# Patient Record
Sex: Male | Born: 2000 | Hispanic: Yes | Marital: Single | Smoking: Never smoker
Health system: Southern US, Community
[De-identification: ages and names within clinical notes are randomized; demographics above are authoritative.]

## PROBLEM LIST (undated history)

## (undated) DIAGNOSIS — M5126 Other intervertebral disc displacement, lumbar region: Secondary | ICD-10-CM

## (undated) HISTORY — PX: WISDOM TOOTH EXTRACTION: SHX21

---
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
## 2020-03-09 ENCOUNTER — Encounter (HOSPITAL_COMMUNITY): Payer: Self-pay | Admitting: *Deleted

## 2020-03-09 ENCOUNTER — Encounter (HOSPITAL_COMMUNITY): Payer: Self-pay

## 2020-03-09 ENCOUNTER — Emergency Department (HOSPITAL_COMMUNITY)
Admission: EM | Admit: 2020-03-09 | Discharge: 2020-03-09 | Disposition: A | Discharge status: Home / Self Care | Payer: Medicaid Other | Attending: Emergency Medicine | Admitting: Emergency Medicine

## 2020-03-09 ENCOUNTER — Emergency Department (HOSPITAL_COMMUNITY): Payer: Medicaid Other

## 2020-03-09 ENCOUNTER — Other Ambulatory Visit: Payer: Self-pay

## 2020-03-09 ENCOUNTER — Ambulatory Visit (HOSPITAL_COMMUNITY)
Admission: EM | Admit: 2020-03-09 | Discharge: 2020-03-09 | Disposition: A | Discharge status: Other Acute Inpatient Hospital | Payer: Medicaid Other

## 2020-03-09 DIAGNOSIS — H53149 Visual discomfort, unspecified: Secondary | ICD-10-CM | POA: Insufficient documentation

## 2020-03-09 DIAGNOSIS — R5383 Other fatigue: Secondary | ICD-10-CM | POA: Insufficient documentation

## 2020-03-09 DIAGNOSIS — R519 Headache, unspecified: Secondary | ICD-10-CM | POA: Insufficient documentation

## 2020-03-09 HISTORY — DX: Other intervertebral disc displacement, lumbar region: M51.26

## 2020-03-09 MED ORDER — DIPHENHYDRAMINE HCL 50 MG/ML IJ SOLN
25.0000 mg | Freq: Once | INTRAMUSCULAR | Status: AC
  Administered 2020-03-09: 25 mg via INTRAVENOUS
  Filled 2020-03-09: qty 1

## 2020-03-09 MED ORDER — KETOROLAC TROMETHAMINE 15 MG/ML IJ SOLN
15.0000 mg | Freq: Once | INTRAMUSCULAR | Status: AC
  Administered 2020-03-09: 15 mg via INTRAVENOUS
  Filled 2020-03-09: qty 1

## 2020-03-09 MED ORDER — PROCHLORPERAZINE EDISYLATE 10 MG/2ML IJ SOLN
10.0000 mg | Freq: Once | INTRAMUSCULAR | Status: AC
  Administered 2020-03-09: 10 mg via INTRAVENOUS
  Filled 2020-03-09: qty 2

## 2020-03-09 MED ORDER — LACTATED RINGERS IV BOLUS
1000.0000 mL | Freq: Once | INTRAVENOUS | Status: AC
  Administered 2020-03-09: 1000 mL via INTRAVENOUS

## 2020-03-09 NOTE — ED Triage Notes (Signed)
Pt sent here from [facility] due to headache and dizziness x 1 week. Reports having episode earlier today of tremor to hand while writing and then "cloudy" vision. No acute distress is noted at triage.

## 2020-03-09 NOTE — ED Triage Notes (Signed)
Pt presents with headache and dizziness X 1 week.

## 2020-03-09 NOTE — ED Provider Notes (Signed)
[facility] EMERGENCY DEPARTMENT Provider Note   CSN: [id number] Arrival date & time: 03/09/20  1206     History Chief Complaint  Patient presents with  . Headache    Steven Allison is a 20 y.o. male with no significant history who presents to the ED for headache. Gradual onset generalized headache for the last week. Patient states he may have struck his head the day before he developed headache. Pain described as throbbing and non-radiating. Associated photophobia. Pain waxing and waning since onset. Not similar to previous headaches. No relief with NSAIDs. Today, patient stated his vision became blurry and noted tremor to his right hand while writing, prompting him to come to the ED for further evaluation. Blurry vision and tremor have since resolved. He reports being under significant stress relating to some of friends. Denies fever, chills, congestion, neck pain, tinnitus, weakness, numbness, dizziness, or trouble walking.  The history is provided by the patient and medical records.  Headache Pain location:  Generalized Quality: throbbing. Radiates to:  Does not radiate Onset quality:  Gradual Duration:  1 week Timing:  Constant Progression:  Waxing and waning Chronicity:  New Similar to prior headaches: no   Context comment:  Spontaneous Relieved by:  Nothing Worsened by:  Light Ineffective treatments:  NSAIDs Associated symptoms: blurred vision, fatigue and photophobia   Associated symptoms: no abdominal pain, no back pain, no congestion, no cough, no dizziness, no ear pain, no eye pain, no fever, no focal weakness, no hearing loss, no loss of balance, no nausea, no neck pain, no neck stiffness, no seizures, no sore throat, no tingling and no vomiting   Risk factors: does not have insomnia        Past Medical History:  Diagnosis Date  . Lumbar herniated disc     Patient Active Problem List   Diagnosis Date Noted  . 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 Back pain with left-sided sciatica 03/24/2016  . Elevated BP 09/18/2012  . Obesity 09/12/2012    Past Surgical History:  Procedure Laterality Date  . WISDOM TOOTH EXTRACTION Right    bottom right side       Family History  Problem Relation Age of Onset  . Epilepsy Maternal Uncle   . Heart attack Maternal Grandfather     Social History   Tobacco Use  . Smoking status: Never Smoker  . Smokeless tobacco: Never Used  Substance Use Topics  . Alcohol use: No  . Drug use: No    Home Medications Prior to Admission medications   Medication Sig Start Date End Date Taking? Authorizing Provider  gabapentin (NEURONTIN) 300 MG capsule Take 1 capsule (300 mg total) by mouth 2 (two) times daily. 06/09/16   Keturah Shavers, MD  naproxen (NAPROSYN) 500 MG tablet Take 1 tablet (500 mg total) by mouth at bedtime as needed. 06/09/16   Keturah Shavers, MD    Allergies    Patient has no known allergies.  Review of Systems   Review of Systems  Constitutional: Positive for fatigue. Negative for chills and fever.  HENT: Negative for congestion, ear pain, hearing loss and sore throat.   Eyes: Positive for blurred vision and photophobia. Negative for pain and visual disturbance.  Respiratory: Negative for cough and shortness of breath.   Cardiovascular: Negative for chest pain and palpitations.  Gastrointestinal: Negative for abdominal pain, nausea and vomiting.  Genitourinary: Negative for dysuria and hematuria.  Musculoskeletal: Negative for arthralgias, back  pain, neck pain and neck stiffness.  Skin: Negative for color change and rash.  Neurological: Positive for headaches. Negative for dizziness, focal weakness, seizures, syncope and loss of balance.  All other systems reviewed and are negative.   Physical Exam Updated Vital Signs BP (!) 155/98   Pulse 78   Temp 98.3 F (36.8 C) (Oral)   Resp 18   SpO2 100%   Physical Exam Vitals and  nursing note reviewed.  Constitutional:      General: He is not in acute distress.    Appearance: Normal appearance. He is well-developed and well-nourished. He is not ill-appearing.  HENT:     Head: Normocephalic and atraumatic.     Right Ear: External ear normal.     Left Ear: External ear normal.     Nose: Nose normal.     Mouth/Throat:     Mouth: Mucous membranes are moist.     Pharynx: Oropharynx is clear. No oropharyngeal exudate or posterior oropharyngeal erythema.  Eyes:     General: No visual field deficit or scleral icterus.       Right eye: No discharge.        Left eye: No discharge.     Extraocular Movements: Extraocular movements intact.     Conjunctiva/sclera: Conjunctivae normal.     Pupils: Pupils are equal, round, and reactive to light.  Cardiovascular:     Rate and Rhythm: Normal rate and regular rhythm.  Pulmonary:     Effort: Pulmonary effort is normal. No respiratory distress.  Musculoskeletal:        General: No edema.     Cervical back: Neck supple.  Skin:    General: Skin is warm and dry.     Findings: No rash.  Neurological:     General: No focal deficit present.     Mental Status: He is alert and oriented to person, place, and time.     GCS: GCS eye subscore is 4. GCS verbal subscore is 5. GCS motor subscore is 6.     Cranial Nerves: Cranial nerves are intact. No cranial nerve deficit, dysarthria or facial asymmetry.     Sensory: Sensation is intact. No sensory deficit.     Motor: Motor function is intact. No weakness.     Coordination: Coordination is intact. Finger-Nose-Finger Test and Heel to [facility] Test normal.  Psychiatric:        Mood and Affect: Mood and affect and mood normal.        Behavior: Behavior normal.     ED Results / Procedures / Treatments   Labs (all labs ordered are listed, but only abnormal results are displayed) Labs Reviewed - No data to display  EKG None  Radiology CT Head Wo Contrast  Result Date:  03/09/2020 CLINICAL DATA:  Persistent headaches. EXAM: CT HEAD WITHOUT CONTRAST TECHNIQUE: Contiguous axial images were obtained from the base of the skull through the vertex without intravenous contrast. COMPARISON:  None. FINDINGS: Brain: No mass lesion, hemorrhage, hydrocephalus, acute infarct, intra-axial, or extra-axial fluid collection. Vascular: No hyperdense vessel or unexpected calcification. Skull: No significant soft tissue swelling.  No skull fracture. Sinuses/Orbits: Normal imaged portions of the orbits and globes. Clear paranasal sinuses and mastoid air cells. Other: None. IMPRESSION: Normal head CT.  No explanation for headaches. Electronically Signed   By: Jeronimo Greaves M.D.   On: 03/09/2020 19:52    Procedures Procedures  Medications Ordered in ED Medications  ketorolac (TORADOL) 15 MG/ML injection 15 mg (15  mg Intravenous Given 03/09/20 2033)  prochlorperazine (COMPAZINE) injection 10 mg (10 mg Intravenous Given 03/09/20 2033)  diphenhydrAMINE (BENADRYL) injection 25 mg (25 mg Intravenous Given 03/09/20 2032)  lactated ringers bolus 1,000 mL (0 mLs Intravenous Stopped 03/09/20 2126)    ED Course  I have reviewed the triage vital signs and the nursing notes.  Pertinent labs & imaging results that were available during my care of the patient were reviewed by me and considered in my medical decision making (see chart for details).    MDM Rules/Calculators/A&P                          Patient is a 19yoM with history and physical as described above who presents to the ED for headache. VS reassuring and HDS. Neurologically intact on exam. Patient resting comfortably and in no acute distress. Initial workup includes CT head. Initial treatment includes Toradol, compazine, benadryl, and IVF.  CT head unremarkable. On reassess following medication administration, patient states he is feeling much better and would like to go home. HPI, physical exam, and diagnostic workup not consistent  with ICH, skull fracture, space occupying lesion, hydrocephalus, meningitis, CVA/TIA, cerebral sinus thrombosis, or other acute emergent pathology. Suspect presentation likely tension headache vs atypical migraine. Recommend follow up with PCP if he continues to have trouble with headaches. Strict return precautions provided and discussed. Questions and concerns addressed. Patient verbalized understanding and amenable with discharge planning. Patient discharge in stable condition. Final Clinical Impression(s) / ED Diagnoses Final diagnoses:  Acute nonintractable headache, unspecified headache type    Rx / DC Orders ED Discharge Orders    None       Tonia Brooms, MD 03/09/20 2135    Eber Hong, MD 03/09/20 2243

## 2020-11-29 ENCOUNTER — Emergency Department (HOSPITAL_BASED_OUTPATIENT_CLINIC_OR_DEPARTMENT_OTHER)
Admission: EM | Admit: 2020-11-29 | Discharge: 2020-11-30 | Disposition: A | Discharge status: Home / Self Care | Payer: Medicaid Other | Attending: Emergency Medicine | Admitting: Emergency Medicine

## 2020-11-29 ENCOUNTER — Encounter (HOSPITAL_BASED_OUTPATIENT_CLINIC_OR_DEPARTMENT_OTHER): Payer: Self-pay | Admitting: *Deleted

## 2020-11-29 ENCOUNTER — Other Ambulatory Visit: Payer: Self-pay

## 2020-11-29 DIAGNOSIS — G44229 Chronic tension-type headache, not intractable: Secondary | ICD-10-CM | POA: Diagnosis not present

## 2020-11-29 DIAGNOSIS — R001 Bradycardia, unspecified: Secondary | ICD-10-CM | POA: Insufficient documentation

## 2020-11-29 DIAGNOSIS — R519 Headache, unspecified: Secondary | ICD-10-CM | POA: Diagnosis present

## 2020-11-29 DIAGNOSIS — H53149 Visual discomfort, unspecified: Secondary | ICD-10-CM | POA: Diagnosis not present

## 2020-11-29 NOTE — ED Triage Notes (Signed)
Pt reports recurrent headache x 3 weeks. Today he took flexeril which helped. Endorses nausea

## 2020-11-30 NOTE — ED Provider Notes (Signed)
[facility] EMERGENCY DEPARTMENT Provider Note   CSN: [id number] Arrival date & time: 11/29/20  1834     History Chief Complaint  Patient presents with   Headache    Steven Allison is a 20 y.o. male.  Patient with no past medical history presents today with complaint of headache.  He states that it is intermittent in nature and has been ongoing for the last 3 weeks.  He denies any associated injury, does endorse some increased stress recently.  He describes the pain as bilateral not preceded by aura and throbbing in nature.  He states that the pain is tight throughout his head and does not radiate.  It is also not positional.  He does endorse some associated photophobia.  Does state that he has had similar headaches in the past.  He has tried Tylenol and ibuprofen without relief of symptoms.  He did state that he took Flexeril which gave him some relief.  He denies any vision changes, dizziness, lightheadedness, fever, chills, congestion, neck pain, tinnitus, weakness, numbness, dizziness, trouble walking, nausea, or vomiting.  Additionally, he states that in the time it took for him to be seen his headache has resolved and he is now asymptomatic.    The history is provided by the patient. No language interpreter was used.  Headache Associated symptoms: no back pain, no congestion, no cough, no diarrhea, no dizziness, no eye pain, no fever, no nausea, no neck pain, no neck stiffness, no numbness, no seizures, no sinus pressure, no vomiting and no weakness       Past Medical History:  Diagnosis Date   Lumbar herniated disc     Patient Active Problem List   Diagnosis Date Noted   Protrusion of intervertebral disc of lumbosacral region 05/09/2016   Back pain with left-sided sciatica 03/24/2016   Elevated BP 09/18/2012   Obesity 09/12/2012    Past Surgical History:  Procedure Laterality Date   WISDOM TOOTH EXTRACTION Right    bottom right side       Family  History  Problem Relation Age of Onset   Epilepsy Maternal Uncle    Heart attack Maternal Grandfather     Social History   Tobacco Use   Smoking status: Never   Smokeless tobacco: Never  Vaping Use   Vaping Use: Never used  Substance Use Topics   Alcohol use: Not Currently   Drug use: No    Home Medications Prior to Admission medications   Medication Sig Start Date End Date Taking? Authorizing Provider  gabapentin (NEURONTIN) 300 MG capsule Take 1 capsule (300 mg total) by mouth 2 (two) times daily. 06/09/16   Keturah Shavers, MD  naproxen (NAPROSYN) 500 MG tablet Take 1 tablet (500 mg total) by mouth at bedtime as needed. 06/09/16   Keturah Shavers, MD    Allergies    Patient has no known allergies.  Review of Systems   Review of Systems  Constitutional:  Negative for chills and fever.  HENT:  Negative for congestion, rhinorrhea, sinus pressure and sinus pain.   Eyes:  Negative for pain.  Respiratory:  Negative for cough and shortness of breath.   Cardiovascular:  Negative for chest pain.  Gastrointestinal:  Negative for diarrhea, nausea and vomiting.  Musculoskeletal:  Negative for back pain, gait problem, neck pain and neck stiffness.  Skin:  Negative for rash.  Neurological:  Positive for headaches. Negative for dizziness, tremors, seizures, syncope, facial asymmetry, speech difficulty, weakness, light-headedness and numbness.  Psychiatric/Behavioral:  Negative for confusion and decreased concentration.   All other systems reviewed and are negative.  Physical Exam Updated Vital Signs BP 133/83   Pulse (!) 44   Temp 98.2 F (36.8 C) (Oral)   Resp 20   Ht 5\' 10"  (1.778 m)   Wt 104.3 kg   SpO2 98%   BMI 33.00 kg/m   Physical Exam Vitals and nursing note reviewed.  Constitutional:      General: He is not in acute distress.    Appearance: Normal appearance. He is well-developed and normal weight. He is not ill-appearing, toxic-appearing or diaphoretic.      Comments: Patient well-appearing, sitting in bed in no acute distress.  HENT:     Head: Normocephalic and atraumatic.     Comments: No signs of trauma or other visible abnormalities. Eyes:     General: No visual field deficit.    Extraocular Movements: Extraocular movements intact.     Right eye: Normal extraocular motion and no nystagmus.     Left eye: Normal extraocular motion and no nystagmus.     Pupils: Pupils are equal, round, and reactive to light.     Right eye: Pupil is round and reactive.     Left eye: Pupil is round and reactive.  Neck:     Meningeal: Brudzinski's sign and Kernig's sign absent.  Cardiovascular:     Rate and Rhythm: Normal rate and regular rhythm.     Heart sounds: Normal heart sounds.     Comments: Bradycardia noted on documented vital signs, however patient's heart rate remained in the 70s throughout my examination. Pulmonary:     Effort: Pulmonary effort is normal. No respiratory distress.     Breath sounds: Normal breath sounds. No stridor. No wheezing, rhonchi or rales.  Chest:     Chest wall: No tenderness.  Abdominal:     General: Bowel sounds are normal.     Palpations: Abdomen is soft.  Musculoskeletal:        General: Normal range of motion.     Cervical back: Normal range of motion and neck supple. No rigidity.  Lymphadenopathy:     Cervical: No cervical adenopathy.  Skin:    General: Skin is warm and dry.  Neurological:     Mental Status: He is alert and oriented to person, place, and time.     GCS: GCS eye subscore is 4. GCS verbal subscore is 5. GCS motor subscore is 6.     Cranial Nerves: No cranial nerve deficit, dysarthria or facial asymmetry.     Sensory: Sensation is intact. No sensory deficit.     Motor: Motor function is intact. No weakness.     Coordination: Coordination is intact. Coordination normal.     Gait: Gait is intact. Gait normal.     Comments: Alert and oriented to self, place, time and event.    Speech is fluent,  clear without dysarthria or dysphasia.    Strength 5/5 in upper/lower extremities   Sensation intact in upper/lower extremities    CN I not tested  CN II grossly intact visual fields bilaterally. Did not visualize posterior eye.  CN III, IV, VI PERRLA and EOMs intact bilaterally  CN V Intact sensation to sharp and light touch to the face  CN VII facial movements symmetric  CN VIII not tested  CN IX, X no uvula deviation, symmetric rise of soft palate  CN XI 5/5 SCM and trapezius strength bilaterally  CN XII Midline  tongue protrusion, symmetric L/R movements   Psychiatric:        Mood and Affect: Mood normal.        Behavior: Behavior normal.    ED Results / Procedures / Treatments   Labs (all labs ordered are listed, but only abnormal results are displayed) Labs Reviewed - No data to display  EKG None  Radiology No results found.  Procedures Procedures   Medications Ordered in ED Medications - No data to display  ED Course  I have reviewed the triage vital signs and the nursing notes.  Pertinent labs & imaging results that were available during my care of the patient were reviewed by me and considered in my medical decision making (see chart for details).    MDM Rules/Calculators/A&P                         Steven Allison presents with headache Given the large differential diagnosis for Steven Allison, the decision making in this case is of high complexity.  After evaluating all of the data points in this case, the presentation of Steven Allison is NOT consistent with skull fracture, meningitis/encephalitis, SAH/sentinel bleed, Intracranial Hemorrhage (ICH) (subdural/epidural), acute obstructive hydrocephalus, space occupying lesions, CVA,  Basilar/vertebral artery dissection, hypertensive emergency, temporal Arteritis, Idiopathic Intracranial Hypertension (pseudotumor cerebri).  Patient with completely normal neuro exam, states that he has had  increased stress recently.  Also endorses that he has been worked up for his headaches several times with interventions including CT imaging at the beginning of the year which was unremarkable for acute findings.  He states that this headache is similar to previous when he had these interventions.  Therefore, I do not feel like additional imaging is indicated.  Furthermore, given extended wait times in the emergency department this evening, the patient states that his headache resolved several hours prior to being seen.  He also states that he did not like headache cocktail given to him several times in the past, he only presented today for recommendations for management of his headaches in the future.  Feel that patient's headaches are likely tension headaches, will recommend management with supportive care including stress reduction, plenty of oral hydration, and Tylenol/ibuprofen as needed for additional relief.  However, patient educated on drug rebound headaches that can occur if you take Tylenol/ibuprofen in excess.  Patient expresses understanding and is amenable with this plan, also told to follow-up with his PCP for further evaluation.  He has been educated on red flag symptoms that would prompt immediate return.  Patient discharged in stable condition.  Strict return and follow-up precautions have been given by me personally or by detailed written instructions verbalized by nursing staff using the teach back method to patient/family/caregiver.  Data Reviewed/Counseling: I have reviewed the patient's vital signs, nursing notes, and other relevant tests/information. I had a detailed discussion regarding the historical points, exam findings, and any diagnostic results supporting the discharge diagnosis. I also discussed the need for outpatient follow-up and the need to return to the ED if symptoms worsen or if there are any questions or concerns that arise at hom   Final Clinical Impression(s) / ED  Diagnoses Final diagnoses:  Chronic tension-type headache, not intractable    Rx / DC Orders ED Discharge Orders     None     An After Visit Summary was printed and given to the patient.    Silva Bandy, PA-C 11/30/20 0101  Vanetta Mulders, MD 12/11/20 [phone]

## 2020-11-30 NOTE — Discharge Instructions (Signed)
You were seen today in the emergency department for evaluation of your headache.  Your neurologic exam was normal and therefore I do not think imaging of your head is warranted.  Fortunately, it seems that your headache has resolved since her presentation to the emergency department.  Your symptoms are consistent with a tension headache which is usually caused from stress.  I recommend you manage her symptoms with over-the-counter Excedrin as needed, however be aware that drug rebound headaches can occur if you take more than the prescribed amount.  Additionally supportive care is indicated including plenty of oral hydration.  It is also very important that you find ways to reduce your stress including deep breathing and relaxation techniques with plenty of rest.  I suspect that this will help you to resolve your symptoms.  If these continue despite these interventions, I recommend follow-up with your primary care for long-term evaluation and management.  Return if development of new or worsening symptoms.

## 2022-02-08 IMAGING — CT CT HEAD W/O CM
4 series · 16 of 47 positions shown, 18 images · non-contrast
Comparison: None.

CLINICAL DATA: Persistent headaches.

EXAM:
CT HEAD WITHOUT CONTRAST
TECHNIQUE: Contiguous axial images were obtained from the base of the skull
through the vertex without intravenous contrast.

[Series 3: head wo · axial · 0.45mm/px · z∈[-122,-2]mm · 7 of 32 slices shown, 9 images]
[im 4/32  brain]
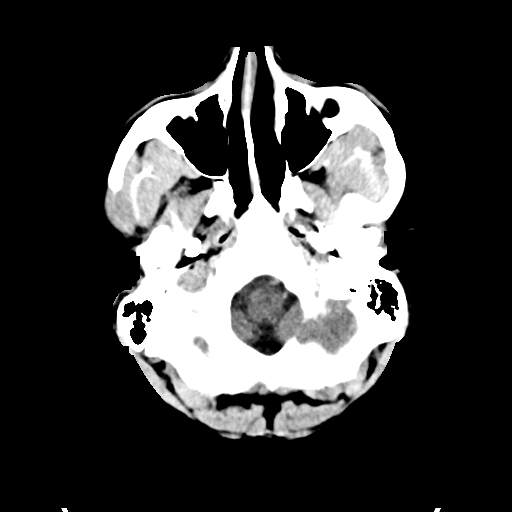
[im 4/32  bone]
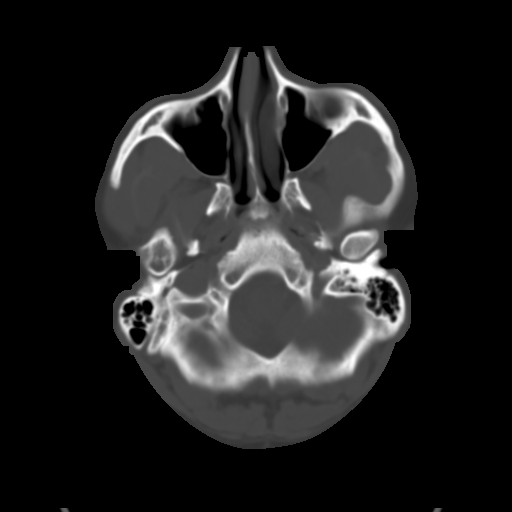
[im 8/32  brain]
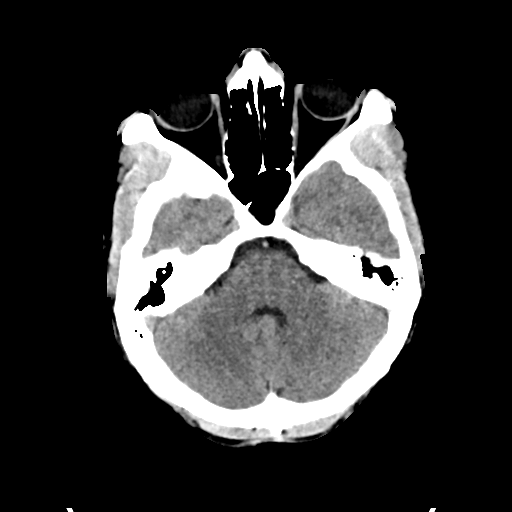
[im 12/32  brain]
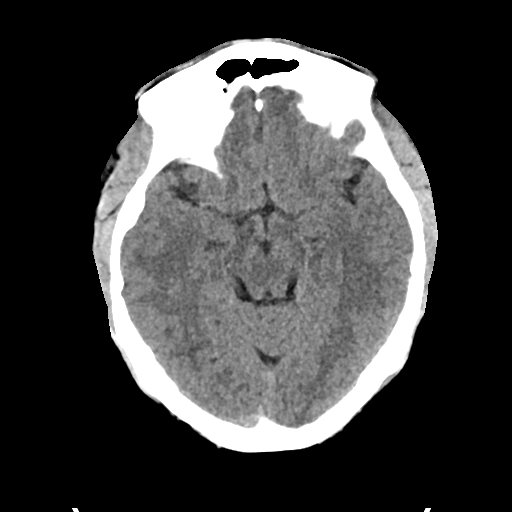
[im 16/32  brain]
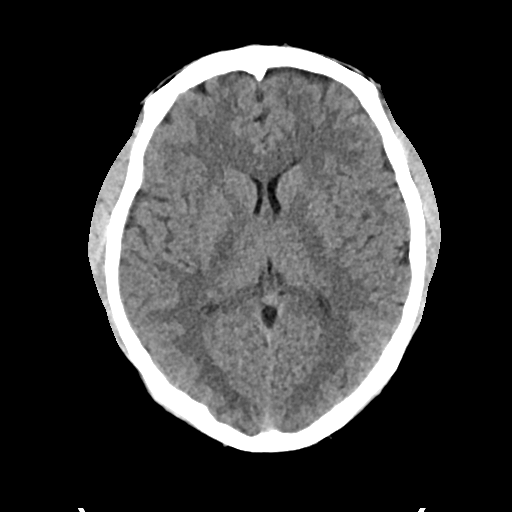
[im 20/32  brain]
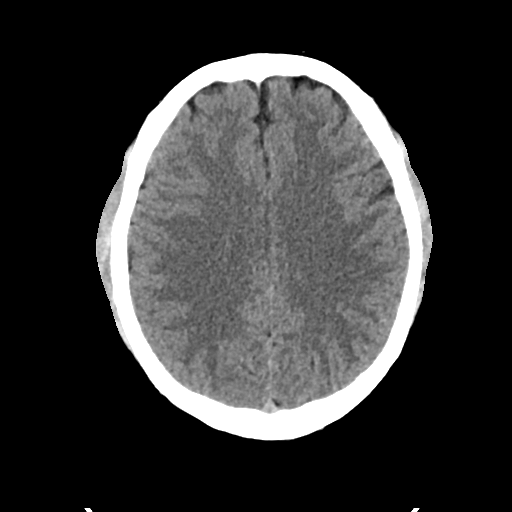
[im 20/32  bone]
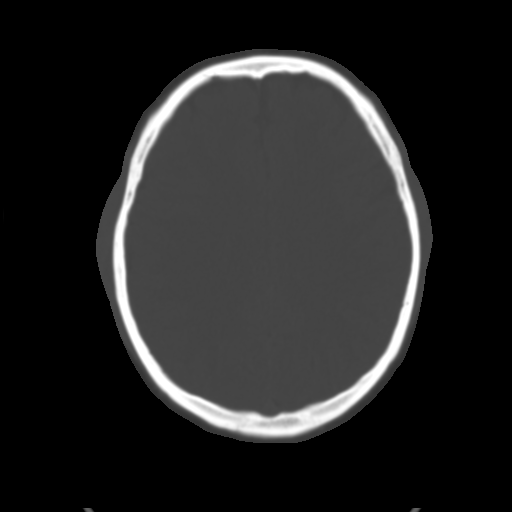
[im 24/32  brain]
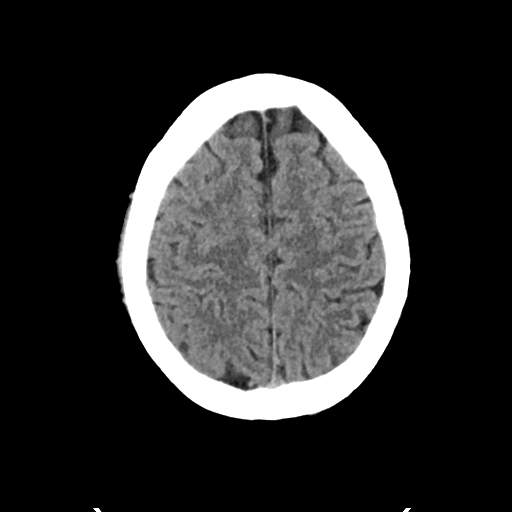
[im 28/32  brain]
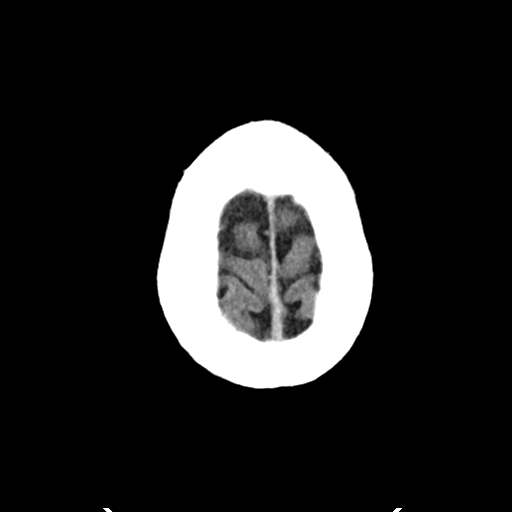

[Series 4: head bone · axial · 0.45mm/px · z∈[-124,-92]mm · 3 of 80 slices shown]
[im 8/80  bone]
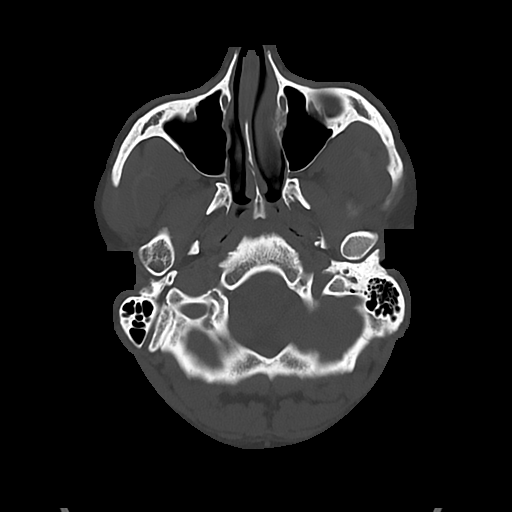
[im 16/80  bone]
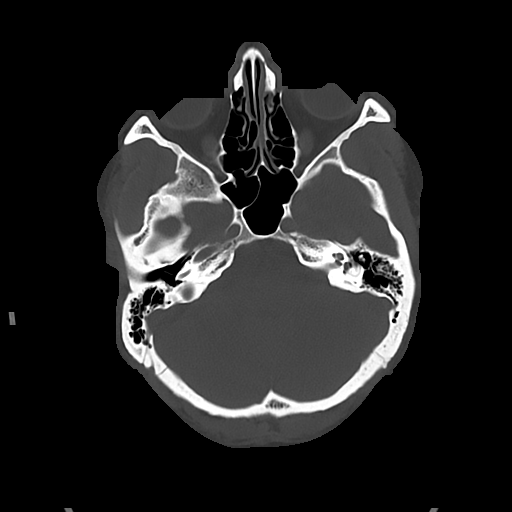
[im 24/80  bone]
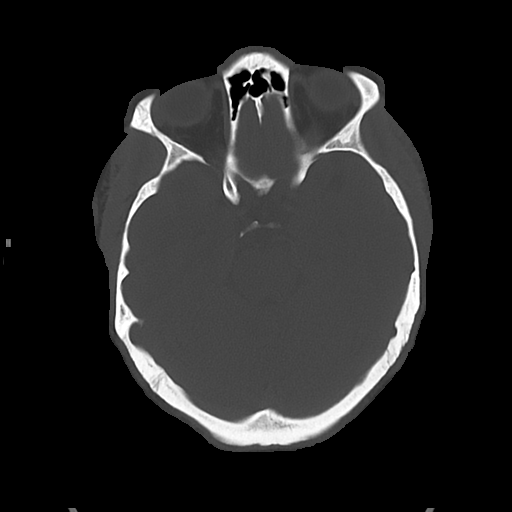

[Series 5: cor soft · coronal · 0.35mm/px · 3 of 71 slices shown]
[im 24/71  brain]
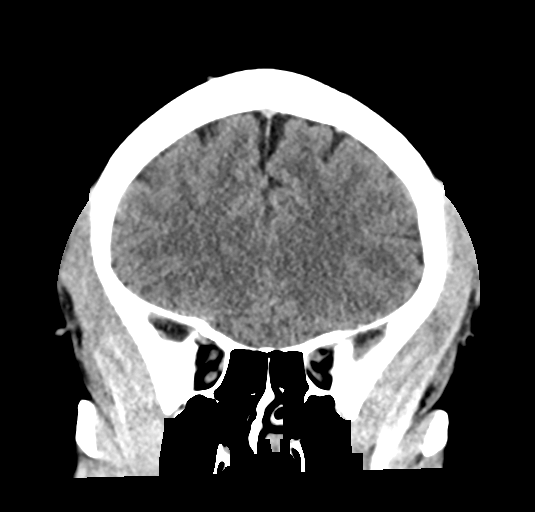
[im 32/71  brain]
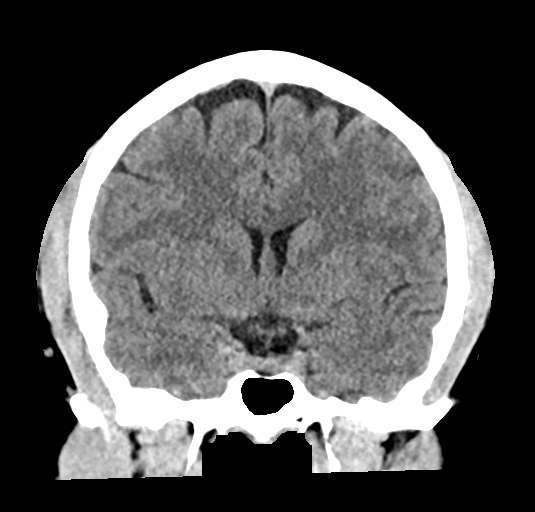
[im 39/71  brain]
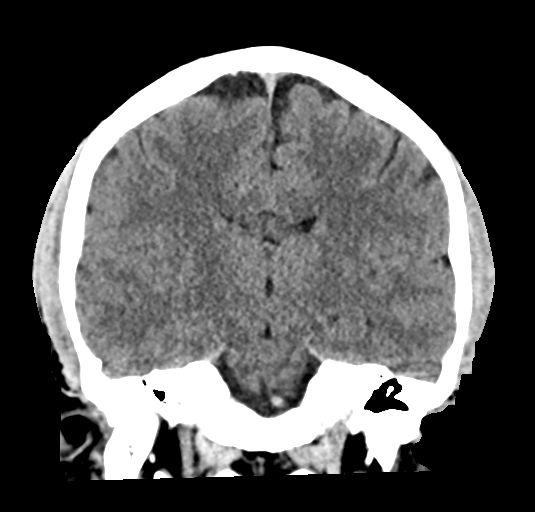

[Series 6: sag soft · sagittal · 0.31mm/px · 3 of 64 slices shown]
[im 22/64  brain]
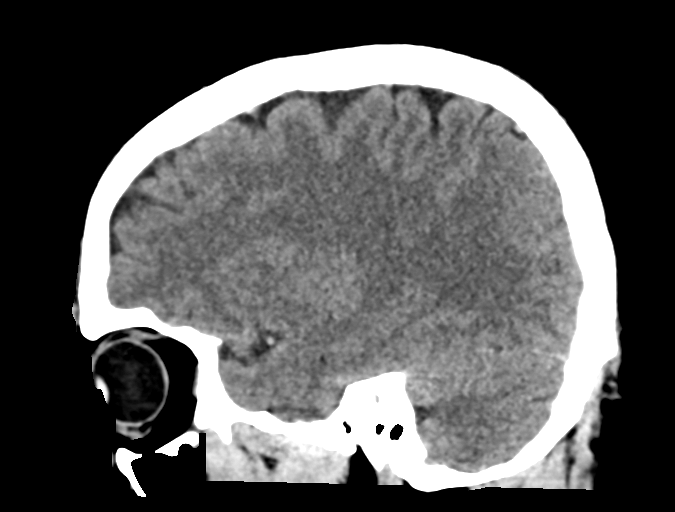
[im 32/64  brain]
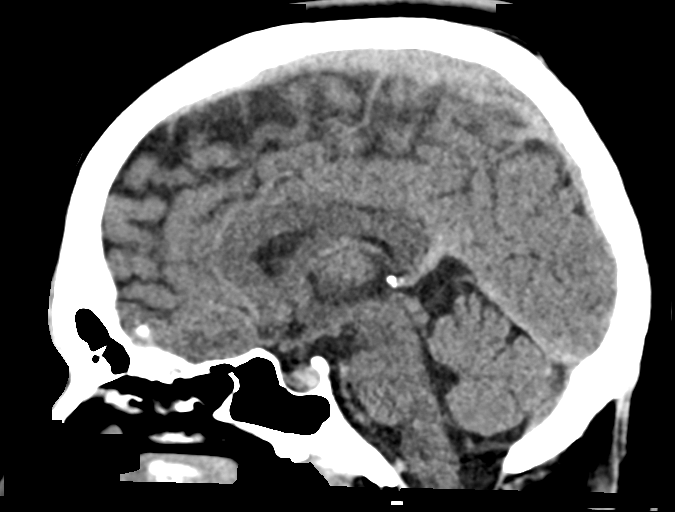
[im 43/64  brain]
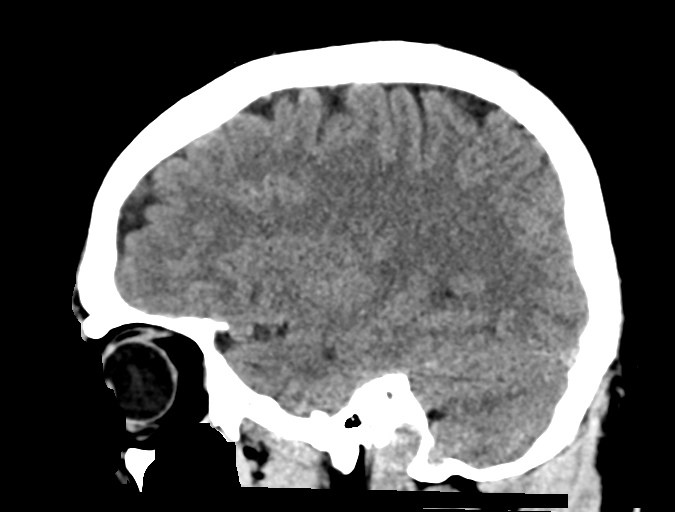

[16 of 47 positions shown; findings below may reference images not displayed]

FINDINGS: Brain: No mass lesion, hemorrhage, hydrocephalus, acute infarct,
intra-axial, or extra-axial fluid collection.

Vascular: No hyperdense vessel or unexpected calcification.

Skull: No significant soft tissue swelling.  No skull fracture.

Sinuses/Orbits: Normal imaged portions of the orbits and globes.
Clear paranasal sinuses and mastoid air cells.

Other: None.
IMPRESSION: Normal head CT.  No explanation for headaches.
